# Patient Record
Sex: Male | Born: 1980 | Race: Black or African American | Hispanic: No | Marital: Single | Smoking: Current every day smoker
Health system: Southern US, Community
[De-identification: ages and names within clinical notes are randomized; demographics above are authoritative.]

---

## 2009-12-28 ENCOUNTER — Emergency Department (HOSPITAL_BASED_OUTPATIENT_CLINIC_OR_DEPARTMENT_OTHER): Admission: EM | Admit: 2009-12-28 | Discharge: 2009-12-28 | Payer: Self-pay | Admitting: Emergency Medicine

## 2016-06-13 ENCOUNTER — Encounter (HOSPITAL_BASED_OUTPATIENT_CLINIC_OR_DEPARTMENT_OTHER): Payer: Self-pay | Admitting: *Deleted

## 2016-06-13 ENCOUNTER — Emergency Department (HOSPITAL_BASED_OUTPATIENT_CLINIC_OR_DEPARTMENT_OTHER)
Admission: EM | Admit: 2016-06-13 | Discharge: 2016-06-13 | Disposition: A | Discharge status: Home / Self Care | Payer: Self-pay | Attending: Emergency Medicine | Admitting: Emergency Medicine

## 2016-06-13 ENCOUNTER — Emergency Department (HOSPITAL_BASED_OUTPATIENT_CLINIC_OR_DEPARTMENT_OTHER): Payer: Self-pay

## 2016-06-13 DIAGNOSIS — N23 Unspecified renal colic: Secondary | ICD-10-CM | POA: Insufficient documentation

## 2016-06-13 DIAGNOSIS — N390 Urinary tract infection, site not specified: Secondary | ICD-10-CM | POA: Insufficient documentation

## 2016-06-13 DIAGNOSIS — E876 Hypokalemia: Secondary | ICD-10-CM | POA: Insufficient documentation

## 2016-06-13 DIAGNOSIS — F17210 Nicotine dependence, cigarettes, uncomplicated: Secondary | ICD-10-CM | POA: Insufficient documentation

## 2016-06-13 LAB — BASIC METABOLIC PANEL
ANION GAP: 12 (ref 5–15)
BUN: 11 mg/dL (ref 6–20)
CALCIUM: 9.4 mg/dL (ref 8.9–10.3)
CO2: 27 mmol/L (ref 22–32)
CREATININE: 1.09 mg/dL (ref 0.61–1.24)
Chloride: 101 mmol/L (ref 101–111)
Glucose, Bld: 123 mg/dL — ABNORMAL HIGH (ref 65–99)
Potassium: 3.4 mmol/L — ABNORMAL LOW (ref 3.5–5.1)
Sodium: 140 mmol/L (ref 135–145)

## 2016-06-13 LAB — URINALYSIS, ROUTINE W REFLEX MICROSCOPIC
GLUCOSE, UA: NEGATIVE mg/dL
KETONES UR: 15 mg/dL — AB
NITRITE: NEGATIVE
PROTEIN: 100 mg/dL — AB
Specific Gravity, Urine: 1.028 (ref 1.005–1.030)
pH: 6 (ref 5.0–8.0)

## 2016-06-13 LAB — CBC WITH DIFFERENTIAL/PLATELET
BASOS ABS: 0 10*3/uL (ref 0.0–0.1)
BASOS PCT: 0 %
EOS ABS: 0 10*3/uL (ref 0.0–0.7)
EOS PCT: 0 %
HCT: 41.4 % (ref 39.0–52.0)
Hemoglobin: 14.3 g/dL (ref 13.0–17.0)
Lymphocytes Relative: 16 %
Lymphs Abs: 1.8 10*3/uL (ref 0.7–4.0)
MCH: 31.4 pg (ref 26.0–34.0)
MCHC: 34.5 g/dL (ref 30.0–36.0)
MCV: 90.8 fL (ref 78.0–100.0)
MONO ABS: 0.4 10*3/uL (ref 0.1–1.0)
MONOS PCT: 4 %
Neutro Abs: 9 10*3/uL — ABNORMAL HIGH (ref 1.7–7.7)
Neutrophils Relative %: 80 %
PLATELETS: 278 10*3/uL (ref 150–400)
RBC: 4.56 MIL/uL (ref 4.22–5.81)
RDW: 13.1 % (ref 11.5–15.5)
WBC: 11.2 10*3/uL — ABNORMAL HIGH (ref 4.0–10.5)

## 2016-06-13 LAB — URINE MICROSCOPIC-ADD ON

## 2016-06-13 MED ORDER — METOCLOPRAMIDE HCL 10 MG PO TABS: 10.0000 mg | ORAL_TABLET | Freq: Four times a day (QID) | ORAL | Status: AC | PRN

## 2016-06-13 MED ORDER — HYDROMORPHONE HCL 1 MG/ML IJ SOLN
1.0000 mg | Freq: Once | INTRAMUSCULAR | Status: AC
  Administered 2016-06-13: 1 mg via INTRAVENOUS
  Filled 2016-06-13: qty 1

## 2016-06-13 MED ORDER — SULFAMETHOXAZOLE-TRIMETHOPRIM 800-160 MG PO TABS
1.0000 | ORAL_TABLET | Freq: Once | ORAL | Status: AC
  Administered 2016-06-13: 1 via ORAL
  Filled 2016-06-13: qty 1

## 2016-06-13 MED ORDER — TAMSULOSIN HCL 0.4 MG PO CAPS: 0.4000 mg | ORAL_CAPSULE | Freq: Every day | ORAL | Status: AC

## 2016-06-13 MED ORDER — SULFAMETHOXAZOLE-TRIMETHOPRIM 800-160 MG PO TABS: 1.0000 | ORAL_TABLET | Freq: Two times a day (BID) | ORAL | Status: AC

## 2016-06-13 MED ORDER — POTASSIUM CHLORIDE CRYS ER 20 MEQ PO TBCR
40.0000 meq | EXTENDED_RELEASE_TABLET | Freq: Once | ORAL | Status: AC
  Administered 2016-06-13: 40 meq via ORAL
  Filled 2016-06-13: qty 2

## 2016-06-13 MED ORDER — HYDROMORPHONE HCL 1 MG/ML IJ SOLN
0.5000 mg | Freq: Once | INTRAMUSCULAR | Status: AC
  Administered 2016-06-13: 0.5 mg via INTRAVENOUS
  Filled 2016-06-13: qty 1

## 2016-06-13 MED ORDER — OXYCODONE-ACETAMINOPHEN 5-325 MG PO TABS: 1.0000 | ORAL_TABLET | Freq: Three times a day (TID) | ORAL | Status: AC | PRN

## 2016-06-13 NOTE — ED Notes (Signed)
Right flank pain since last night. Movement makes the pain worse. States he thinks he has a kidney stone.

## 2016-06-13 NOTE — Discharge Instructions (Signed)
Kidney Stones Appointment office has been scheduled for you for tomorrow at 9:30 AM. Take any insurance cards that you may have with you as well as a photo ID to the office. Kidney stones (urolithiasis) are solid masses that form inside your kidneys. The intense pain is caused by the stone moving through the kidney, ureter, bladder, and urethra (urinary tract). When the stone moves, the ureter starts to spasm around the stone. The stone is usually passed in your pee (urine).  HOME CARE  Drink enough fluids to keep your pee clear or pale yellow. This helps to get the stone out.  Take a 24-hour pee (urine) sample as told by your doctor. You may need to take another sample every 6-12 months.  Strain all pee through the provided strainer. Do not pee without peeing through the strainer, not even once. If you pee the stone out, catch it in the strainer. The stone may be as small as a grain of salt. Take this to your doctor. This will help your doctor figure out what you can do to try to prevent more kidney stones.  Only take medicine as told by your doctor.  Make changes to your daily diet as told by your doctor. You may be told to:  Limit how much salt you eat.  Eat 5 or more servings of fruits and vegetables each day.  Limit how much meat, poultry, fish, and eggs you eat.  Keep all follow-up visits as told by your doctor. This is important.  Get follow-up X-rays as told by your doctor. GET HELP IF: You have pain that gets worse even if you have been taking pain medicine. GET HELP RIGHT AWAY IF:   Your pain does not get better with medicine.  You have a fever or shaking chills.  Your pain increases and gets worse over 18 hours.  You have new belly (abdominal) pain.  You feel faint or pass out.  You are unable to pee.   This information is not intended to replace advice given to you by your health care provider. Make sure you discuss any questions you have with your health care  provider.   Document Released: 05/20/2008 Document Revised: 08/23/2015 Document Reviewed: 05/05/2013 Elsevier Interactive Patient Education [employer]2016 [employer]

## 2016-06-13 NOTE — ED Provider Notes (Signed)
CSN: [id number]     Arrival date & time 06/13/16  1130 History   First MD Initiated Contact with Patient 06/13/16 1204     Chief Complaint  Patient presents with  . Flank Pain     (Consider location/radiation/quality/duration/timing/severity/associated sxs/prior Treatment) HPI Complains of right-sided flank pain nonradiating onset last night with 3 or 4 episodes of vomiting. No fever. No other associated symptoms no nausea present. No treatment prior to coming here nothing makes symptoms better or worse. Pain is severe. No abdominal pain last bowel movement this morning, "runny" no hematemesis no blood per rectum. History reviewed. No pertinent past medical history. Past medical history scoliosis History reviewed. No pertinent past surgical history. No family history on file. Social History  Substance Use Topics  . Smoking status: Current Every Day Smoker    Types: Cigarettes  . Smokeless tobacco: None  . Alcohol Use: No  No drug use  Review of Systems  Constitutional: Negative.   HENT: Negative.   Respiratory: Negative.   Cardiovascular: Negative.   Gastrointestinal: Positive for vomiting.  Genitourinary: Positive for flank pain.  Musculoskeletal: Negative.   Skin: Negative.   Neurological: Negative.   Psychiatric/Behavioral: Negative.   All other systems reviewed and are negative.     Allergies  Review of patient's allergies indicates no known allergies.  Home Medications   Prior to Admission medications   Not on File   BP 120/101 mmHg  Pulse 106  Temp(Src) 98.7 F (37.1 C) (Oral)  Resp 18  Ht 6' (1.829 m)  Wt 165 lb (74.844 kg)  BMI 22.37 kg/m2  SpO2 100% Physical Exam  Constitutional: He appears well-developed and well-nourished. He appears distressed.  Appears uncomfortable  HENT:  Head: Normocephalic and atraumatic.  Eyes: Conjunctivae are normal. Pupils are equal, round, and reactive to light.  Neck: Neck supple. No tracheal deviation present. No  thyromegaly present.  Cardiovascular: Normal rate and regular rhythm.   No murmur heard. Pulmonary/Chest: Effort normal and breath sounds normal.  Abdominal: Soft. Bowel sounds are normal. He exhibits no distension. There is no tenderness.  Genitourinary: Penis normal.  Right flank tenderness  Musculoskeletal: Normal range of motion. He exhibits no edema or tenderness.  Neurological: He is alert. Coordination normal.  Skin: Skin is warm and dry. No rash noted.  Psychiatric: He has a normal mood and affect.  Nursing note and vitals reviewed.   ED Course  Procedures (including critical care time) Labs Review Labs Reviewed  URINALYSIS, ROUTINE W REFLEX MICROSCOPIC (NOT AT [facility]) - Abnormal; Notable for the following:    Color, Urine RED (*)    APPearance TURBID (*)    Hgb urine dipstick LARGE (*)    Bilirubin Urine MODERATE (*)    Ketones, ur 15 (*)    Protein, ur 100 (*)    Leukocytes, UA MODERATE (*)    All other components within normal limits  URINE MICROSCOPIC-ADD ON - Abnormal; Notable for the following:    Squamous Epithelial / LPF 0-5 (*)    Bacteria, UA MANY (*)    All other components within normal limits  CBC WITH DIFFERENTIAL/PLATELET - Abnormal; Notable for the following:    WBC 11.2 (*)    Neutro Abs 9.0 (*)    All other components within normal limits  URINE CULTURE  BASIC METABOLIC PANEL   Results for orders placed or performed during the hospital encounter of 06/13/16  Urinalysis, Routine w reflex microscopic (not at [facility])  Result Value Ref Range  Color, Urine RED (A) YELLOW   APPearance TURBID (A) CLEAR   Specific Gravity, Urine 1.028 1.005 - 1.030   pH 6.0 5.0 - 8.0   Glucose, UA NEGATIVE NEGATIVE mg/dL   Hgb urine dipstick LARGE (A) NEGATIVE   Bilirubin Urine MODERATE (A) NEGATIVE   Ketones, ur 15 (A) NEGATIVE mg/dL   Protein, ur 696100 (A) NEGATIVE mg/dL   Nitrite NEGATIVE NEGATIVE   Leukocytes, UA MODERATE (A) NEGATIVE  Urine microscopic-add on   Result Value Ref Range   Squamous Epithelial / LPF 0-5 (A) NONE SEEN   WBC, UA TOO NUMEROUS TO COUNT 0 - 5 WBC/hpf   RBC / HPF TOO NUMEROUS TO COUNT 0 - 5 RBC/hpf   Bacteria, UA MANY (A) NONE SEEN  CBC with Differential/Platelet  Result Value Ref Range   WBC 11.2 (H) 4.0 - 10.5 K/uL   RBC 4.56 4.22 - 5.81 MIL/uL   Hemoglobin 14.3 13.0 - 17.0 g/dL   HCT 29.541.4 28.439.0 - 13.252.0 %   MCV 90.8 78.0 - 100.0 fL   MCH 31.4 26.0 - 34.0 pg   MCHC 34.5 30.0 - 36.0 g/dL   RDW 44.013.1 10.211.5 - 72.515.5 %   Platelets 278 150 - 400 K/uL   Neutrophils Relative % 80 %   Neutro Abs 9.0 (H) 1.7 - 7.7 K/uL   Lymphocytes Relative 16 %   Lymphs Abs 1.8 0.7 - 4.0 K/uL   Monocytes Relative 4 %   Monocytes Absolute 0.4 0.1 - 1.0 K/uL   Eosinophils Relative 0 %   Eosinophils Absolute 0.0 0.0 - 0.7 K/uL   Basophils Relative 0 %   Basophils Absolute 0.0 0.0 - 0.1 K/uL  Basic metabolic panel  Result Value Ref Range   Sodium 140 135 - 145 mmol/L   Potassium 3.4 (L) 3.5 - 5.1 mmol/L   Chloride 101 101 - 111 mmol/L   CO2 27 22 - 32 mmol/L   Glucose, Bld 123 (H) 65 - 99 mg/dL   BUN 11 6 - 20 mg/dL   Creatinine, Ser 3.661.09 0.61 - 1.24 mg/dL   Calcium 9.4 8.9 - 44.010.3 mg/dL   GFR calc non Af Amer >60 >60 mL/min   GFR calc Af Amer >60 >60 mL/min   Anion gap 12 5 - 15   Ct Renal Stone Study  06/13/2016  CLINICAL DATA:  Right flank pain starting last night EXAM: CT ABDOMEN AND PELVIS WITHOUT CONTRAST TECHNIQUE: Multidetector CT imaging of the abdomen and pelvis was performed following the standard protocol without IV contrast. COMPARISON:  None. FINDINGS: Lower chest:  The lung bases are unremarkable. Hepatobiliary: Unenhanced liver is unremarkable. No biliary ductal dilatation. No calcified gallstones are noted within gallbladder. Pancreas: Unenhanced pancreas is unremarkable. Spleen: Unenhanced spleen is unremarkable. Adrenals/Urinary Tract: No adrenal gland mass. There is nonobstructive calculus midpole of the right kidney  measures 3 mm. Mild right hydronephrosis and minimal proximal right hydroureter. In axial image 32 there is 6 x 5 mm calcified obstructive calculus in mid right ureter about L4 vertebral body level. Bilateral distal ureter is poorly visualized. Limited assessment of urinary bladder which is empty. Multiple pelvic phleboliths are noted. Stomach/Bowel: There is no small bowel obstruction. No gastric outlet obstruction. No pericecal inflammation. The appendix is not identified. No distal colonic obstruction. Vascular/Lymphatic: No aortic aneurysm. No retroperitoneal or mesenteric adenopathy. Reproductive: No pelvic mass is noted. Prostate gland is unremarkable. Other: No ascites or free abdominal air. Musculoskeletal: There is levoscoliosis of lumbar spine. Partially visualized  dextroscoliosis of lower thoracic spine. Sagittal images of the spine shows no destructive bony lesions. Mild anterior spurring upper endplate of L5 vertebral body. Mild disc bulge at L4-L5 and L5-S1 level. IMPRESSION: 1. There is mild right hydronephrosis and right hydroureter. There is 6 mm calcified obstructive calculus in mid right ureter about L4 level. 2. Limited assessment of urinary bladder which is empty. 3. No small bowel obstruction. 4. The appendix is not identified. 5. Mild degenerative changes lumbar spine. There is dextroscoliosis lower thoracic spine. Levoscoliosis lumbar spine. Electronically Signed   By: Natasha Mead M.D.   On: 06/13/2016 12:57    Imaging Review No results found. I have personally reviewed and evaluated these images and lab results as part of my medical decision-making.   EKG Interpretation None     2:05 PM feels much improved after treatment with intervenous hydromorphone. Nausea is well controlled. Appears comfortable. He is requesting a small dose of additional pain medicine additional hydromorphone ordered. 3:40 PM patient feels ready to go home. Pain well controlled MDM  Case discussed with Dr.  Ronne Binning. Plan urine sent for culture. Prescription Bactrim DS, Percocet, Flomax, Reglan. An appointment has been scheduled at the office of [facility] for tomorrow at 9:30 AM Diagnosis #1 ureteral colic #2 urinary tract infection #3 hypokalemia Final diagnoses:  None        Doug Sou, MD 06/13/16 1542

## 2016-06-14 LAB — URINE CULTURE: Special Requests: NORMAL

## 2017-05-04 IMAGING — CT CT RENAL STONE PROTOCOL
2 of 4 series · 16 of 46 positions shown, 18 images · non-contrast
Comparison: None.

CLINICAL DATA: Right flank pain starting last night

EXAM:
CT ABDOMEN AND PELVIS WITHOUT CONTRAST
TECHNIQUE: Multidetector CT imaging of the abdomen and pelvis was performed
following the standard protocol without IV contrast.

[Series 2: axial st · axial · 0.62mm/px · z∈[-443,-88]mm · 13 of 79 slices shown, 15 images]
[im 4/79  soft-tissue]
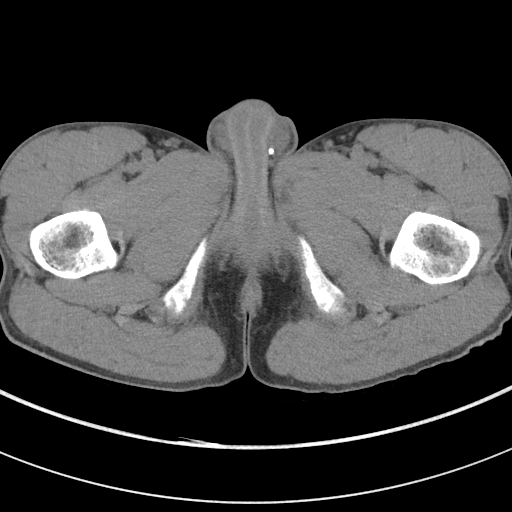
[im 4/79  bone]
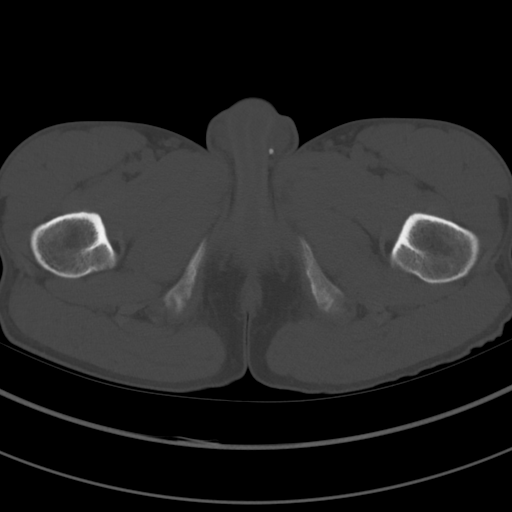
[im 10/79  soft-tissue]
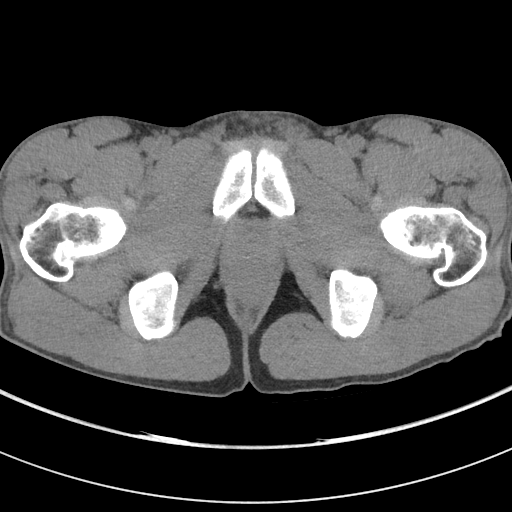
[im 17/79  soft-tissue]
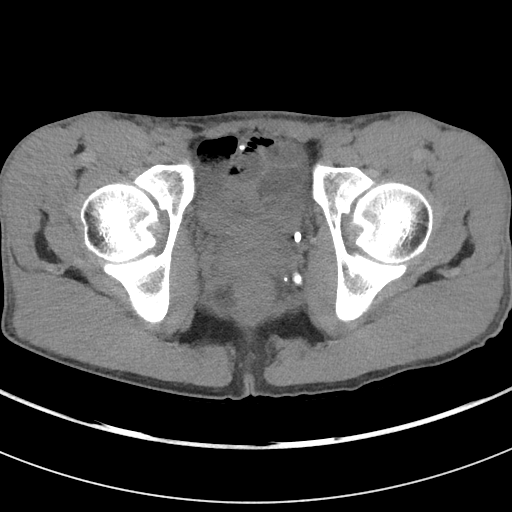
[im 23/79  soft-tissue]
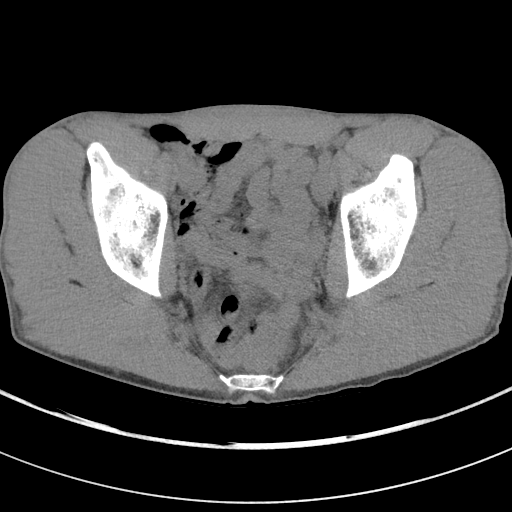
[im 27/79  soft-tissue]
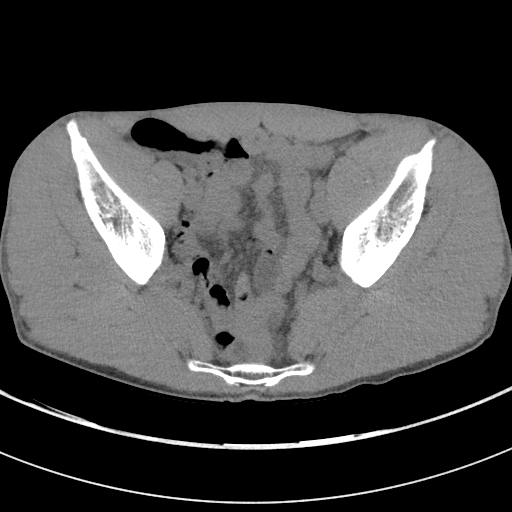
[im 33/79  soft-tissue]
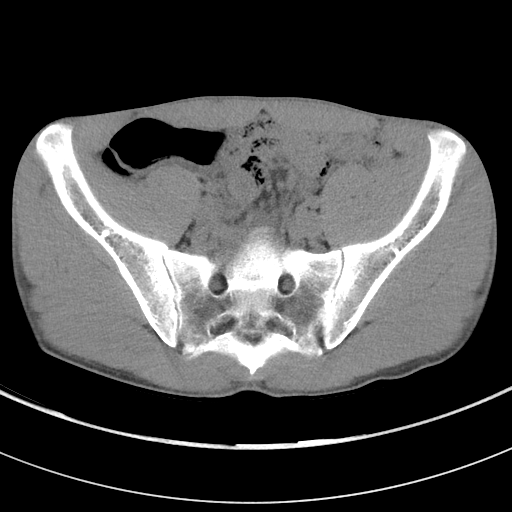
[im 40/79  soft-tissue]
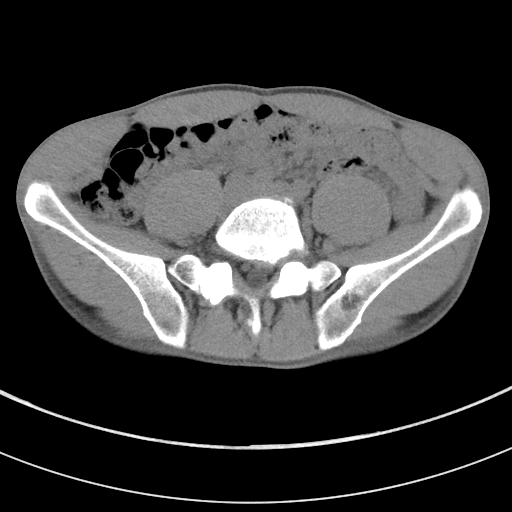
[im 46/79  soft-tissue]
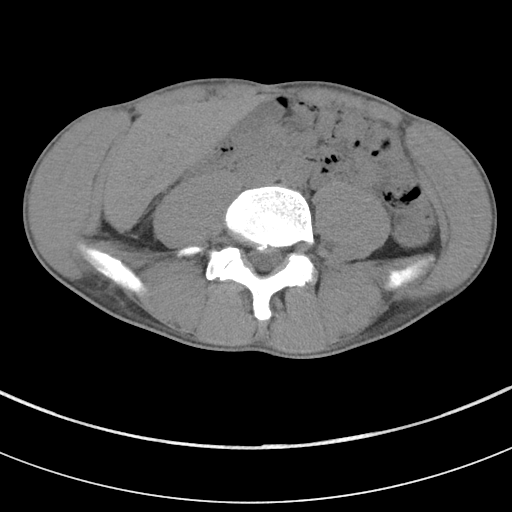
[im 53/79  soft-tissue]
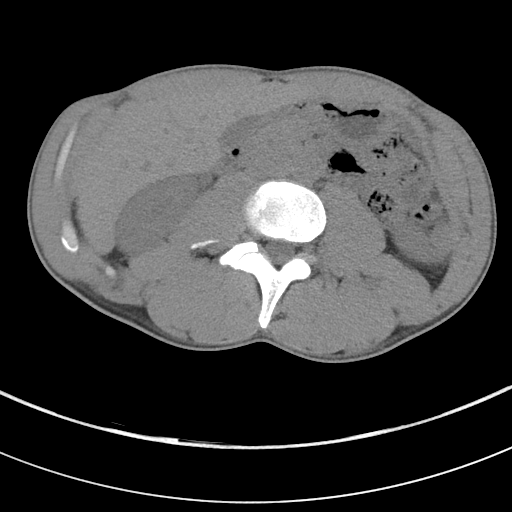
[im 53/79  bone]
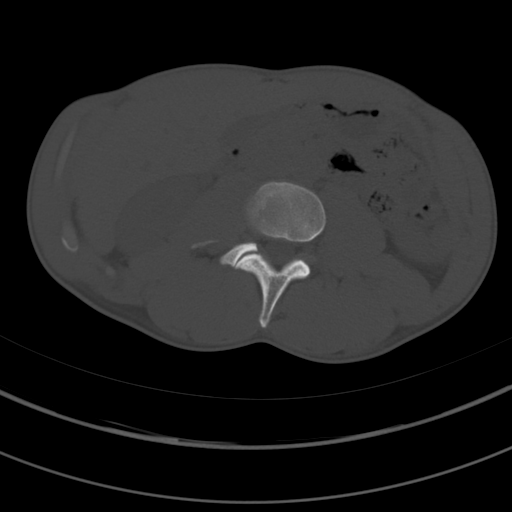
[im 56/79  soft-tissue]
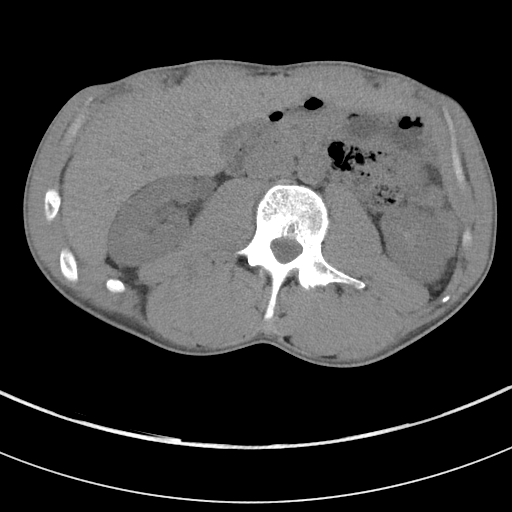
[im 62/79  soft-tissue]
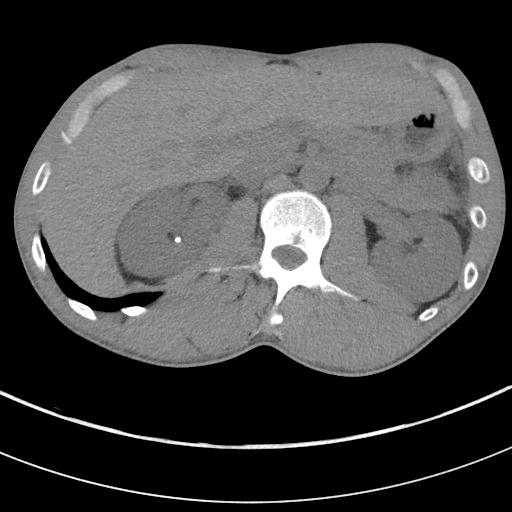
[im 69/79  soft-tissue]
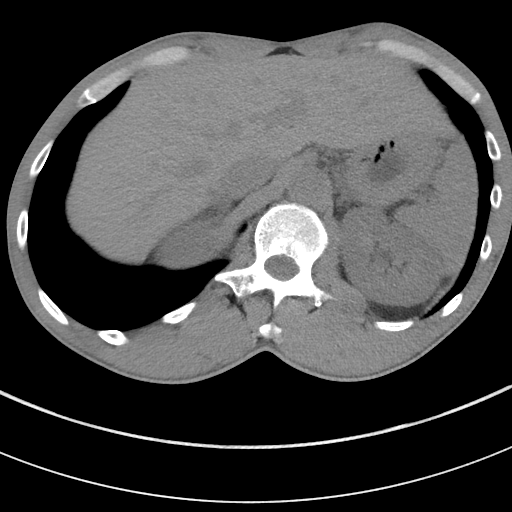
[im 75/79  soft-tissue]
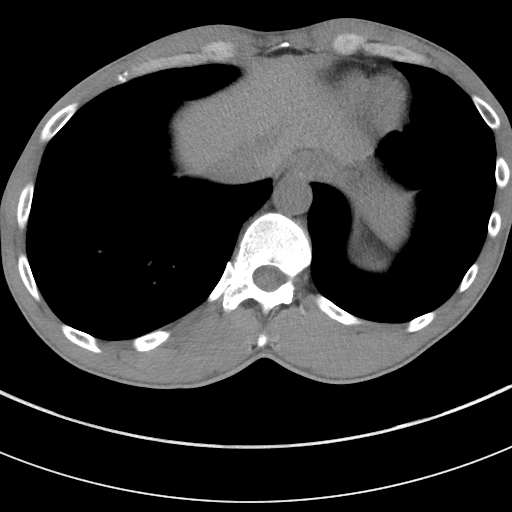

[Series 4: coronal st · coronal · 0.71mm/px · 3 of 74 slices shown]
[im 25/74  soft-tissue]
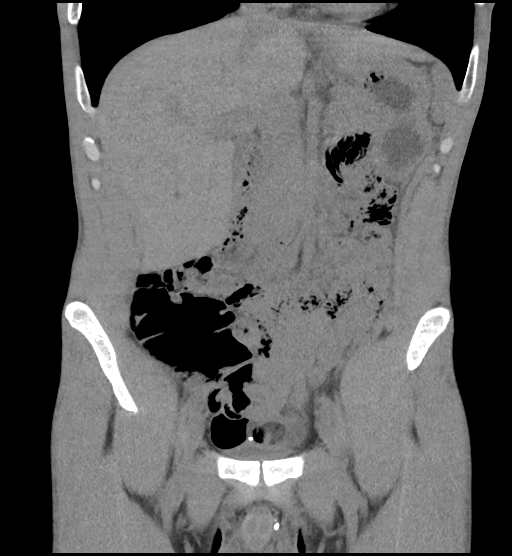
[im 33/74  soft-tissue]
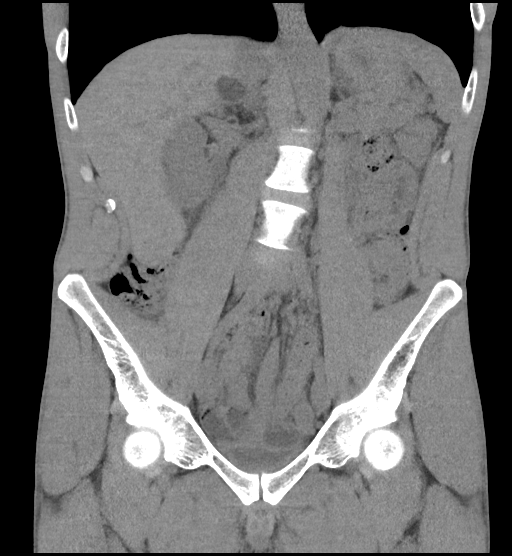
[im 41/74  soft-tissue]
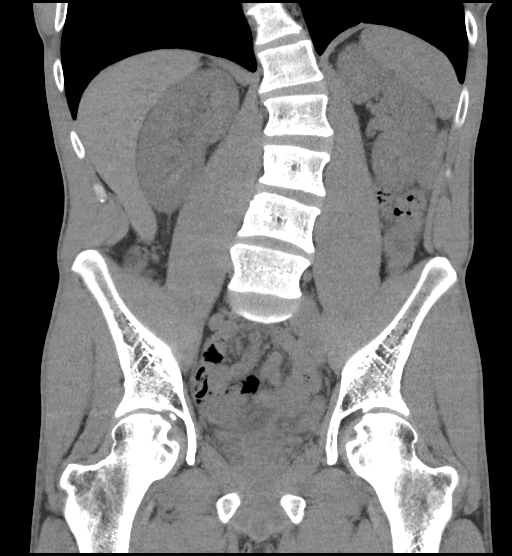

[16 of 46 positions shown; findings below may reference images not displayed]

FINDINGS: Lower chest:  The lung bases are unremarkable.

Hepatobiliary: Unenhanced liver is unremarkable. No biliary ductal
dilatation. No calcified gallstones are noted within gallbladder.

Pancreas: Unenhanced pancreas is unremarkable.

Spleen: Unenhanced spleen is unremarkable.

Adrenals/Urinary Tract: No adrenal gland mass.

There is nonobstructive calculus midpole of the right kidney
measures 3 mm. Mild right hydronephrosis and minimal proximal right
hydroureter.

In axial image 32 there is 6 x 5 mm calcified obstructive calculus
in mid right ureter about L4 vertebral body level. Bilateral distal
ureter is poorly visualized. Limited assessment of urinary bladder
which is empty. Multiple pelvic phleboliths are noted.

Stomach/Bowel: There is no small bowel obstruction. No gastric
outlet obstruction. No pericecal inflammation. The appendix is not
identified. No distal colonic obstruction.

Vascular/Lymphatic: No aortic aneurysm. No retroperitoneal or
mesenteric adenopathy.

Reproductive: No pelvic mass is noted. Prostate gland is
unremarkable.

Other: No ascites or free abdominal air.

Musculoskeletal: There is levoscoliosis of lumbar spine. Partially
visualized dextroscoliosis of lower thoracic spine.

Sagittal images of the spine shows no destructive bony lesions. Mild
anterior spurring upper endplate of L5 vertebral body. Mild disc
bulge at L4-L5 and L5-S1 level.
IMPRESSION: 1. There is mild right hydronephrosis and right hydroureter. There
is 6 mm calcified obstructive calculus in mid right ureter about L4
level.
2. Limited assessment of urinary bladder which is empty.
3. No small bowel obstruction.
4. The appendix is not identified.
5. Mild degenerative changes lumbar spine. There is dextroscoliosis
lower thoracic spine. Levoscoliosis lumbar spine.
